# Patient Record
Sex: Female | Born: 1975 | Race: Black or African American | Hispanic: No | Marital: Single | Smoking: Never smoker
Health system: Southern US, Community
[De-identification: ages and names within clinical notes are randomized; demographics above are authoritative.]

## PROBLEM LIST (undated history)

## (undated) DIAGNOSIS — I10 Essential (primary) hypertension: Secondary | ICD-10-CM

## (undated) HISTORY — PX: ABDOMINAL HYSTERECTOMY: SHX81

## (undated) HISTORY — PX: KNEE SURGERY: SHX244

---

## 2008-10-12 ENCOUNTER — Emergency Department (HOSPITAL_BASED_OUTPATIENT_CLINIC_OR_DEPARTMENT_OTHER): Admission: EM | Admit: 2008-10-12 | Discharge: 2008-10-12 | Payer: Self-pay | Admitting: Emergency Medicine

## 2018-03-24 ENCOUNTER — Encounter (HOSPITAL_BASED_OUTPATIENT_CLINIC_OR_DEPARTMENT_OTHER): Payer: Self-pay | Admitting: *Deleted

## 2018-03-24 ENCOUNTER — Emergency Department (HOSPITAL_BASED_OUTPATIENT_CLINIC_OR_DEPARTMENT_OTHER): Payer: Self-pay

## 2018-03-24 ENCOUNTER — Other Ambulatory Visit: Payer: Self-pay

## 2018-03-24 DIAGNOSIS — M1712 Unilateral primary osteoarthritis, left knee: Secondary | ICD-10-CM | POA: Insufficient documentation

## 2018-03-24 DIAGNOSIS — I10 Essential (primary) hypertension: Secondary | ICD-10-CM | POA: Insufficient documentation

## 2018-03-24 NOTE — ED Triage Notes (Signed)
Pain in left knee since Sunday. Denies injury. States pain is worsening and now there is swelling. Hurts to walk

## 2018-03-25 ENCOUNTER — Emergency Department (HOSPITAL_BASED_OUTPATIENT_CLINIC_OR_DEPARTMENT_OTHER)
Admission: EM | Admit: 2018-03-25 | Discharge: 2018-03-25 | Disposition: A | Discharge status: Home / Self Care | Payer: Self-pay | Attending: Emergency Medicine | Admitting: Emergency Medicine

## 2018-03-25 DIAGNOSIS — M1712 Unilateral primary osteoarthritis, left knee: Secondary | ICD-10-CM

## 2018-03-25 HISTORY — DX: Essential (primary) hypertension: I10

## 2018-03-25 MED ORDER — MELOXICAM 7.5 MG PO TABS: 7.5000 mg | ORAL_TABLET | Freq: Every day | ORAL | 0 refills | Status: AC

## 2018-03-25 MED ORDER — MELOXICAM 7.5 MG PO TABS
7.5000 mg | ORAL_TABLET | Freq: Once | ORAL | Status: AC
  Administered 2018-03-25: 7.5 mg via ORAL
  Filled 2018-03-25: qty 1

## 2018-03-25 NOTE — ED Notes (Signed)
Pt given d/c instructions as per chart. Rx x 1. Verbalizes understanding. No questions. 

## 2018-03-25 NOTE — ED Provider Notes (Signed)
[facility] EMERGENCY DEPARTMENT Provider Note   CSN: [id number] Arrival date & time: 03/24/18  2304     History   Chief Complaint Chief Complaint  Patient presents with  . Knee Pain    HPI Michaela Jimenez is a 42 y.o. female.   Knee Pain   This is a new problem. The current episode started more than 2 days ago. The problem occurs daily. The problem has not changed since onset.The pain is present in the left knee. The quality of the pain is described as aching and sharp. The pain is mild. The symptoms are aggravated by standing and activity. She has tried nothing for the symptoms. There has been no history of extremity trauma. Family history is significant for no gout.    Past Medical History:  Diagnosis Date  . Hypertension     There are no active problems to display for this patient.   Past Surgical History:  Procedure Laterality Date  . ABDOMINAL HYSTERECTOMY    . CESAREAN SECTION    . KNEE SURGERY Bilateral      OB History   None      Home Medications    Prior to Admission medications   Medication Sig Start Date End Date Taking? Authorizing Provider  meloxicam (MOBIC) 7.5 MG tablet Take 1 tablet (7.5 mg total) by mouth daily. 03/25/18   Lyana Asbill, Barbara CowerJason, MD    Family History No family history on file.  Social History Social History   Tobacco Use  . Smoking status: Never Smoker  . Smokeless tobacco: Never Used  Substance Use Topics  . Alcohol use: Yes  . Drug use: Never     Allergies   Sulfa antibiotics   Review of Systems Review of Systems  Constitutional: Negative for fever.  Musculoskeletal: Positive for joint swelling (left knee, no redness or fever).  All other systems reviewed and are negative.    Physical Exam Updated Vital Signs BP (!) 134/97 (BP Location: Right Arm)   Pulse 80   Temp 98.1 F (36.7 C) (Oral)   Resp 20   Ht 5' (1.524 m)   Wt (!) 141.5 kg (312 lb)   SpO2 96%   BMI 60.93 kg/m   Physical Exam    Constitutional: She is oriented to person, place, and time. She appears well-developed and well-nourished.  HENT:  Head: Normocephalic and atraumatic.  Eyes: Conjunctivae and EOM are normal.  Neck: Normal range of motion.  Cardiovascular: Normal rate and regular rhythm.  Pulmonary/Chest: No stridor. No respiratory distress.  Abdominal: Soft. She exhibits no distension.  Musculoskeletal: Normal range of motion. She exhibits no edema, tenderness or deformity.  No obvious left knee edema, ttp, erythema, warmth or effusion  Neurological: She is alert and oriented to person, place, and time. No cranial nerve deficit. Coordination normal.  Skin: Skin is warm and dry.  Nursing note and vitals reviewed.    ED Treatments / Results  Labs (all labs ordered are listed, but only abnormal results are displayed) Labs Reviewed - No data to display  EKG None  Radiology Dg Knee Complete 4 Views Left  Result Date: 03/25/2018 CLINICAL DATA:  Acute onset left knee pain 1 week ago. No recent injury. EXAM: LEFT KNEE - COMPLETE 4+ VIEW COMPARISON:  None. FINDINGS: Tricompartmental osteoarthritis of the left knee without fracture or joint dislocation. No intra-articular loose bodies. Small suprapatellar joint effusion. No suspicious osseous lesions. Mild soft tissue induration along the lateral thigh. IMPRESSION: Soft tissue induration of  the lateral thigh. Tricompartmental osteoarthritis of the left knee without acute osseous abnormality. Small joint effusion Electronically Signed   By: Tollie Eth M.D.   On: 03/25/2018 00:46    Procedures Procedures (including critical care time)  Medications Ordered in ED Medications  meloxicam (MOBIC) tablet 7.5 mg (7.5 mg Oral Given 03/25/18 0214)     Initial Impression / Assessment and Plan / ED Course  I have reviewed the triage vital signs and the nursing notes.  Pertinent labs & imaging results that were available during my care of the patient were reviewed  by me and considered in my medical decision making (see chart for details).     Suspect weight as contributign factor along with arthritis. Will rx nsaids with pcp follow up. Ace wrap prn.   Final Clinical Impressions(s) / ED Diagnoses   Final diagnoses:  Osteoarthritis of left knee, unspecified osteoarthritis type    ED Discharge Orders        Ordered    meloxicam (MOBIC) 7.5 MG tablet  Daily     03/25/18 0221       Demetries Coia, Barbara Cower, MD 03/25/18 [phone]

## 2019-05-03 IMAGING — DX DG KNEE COMPLETE 4+V*L*
4 series · 4 of 4 positions shown · non-contrast
Comparison: None.

CLINICAL DATA: Acute onset left knee pain 1 week ago. No recent
injury.

EXAM:
LEFT KNEE - COMPLETE 4+ VIEW

[knee ap]
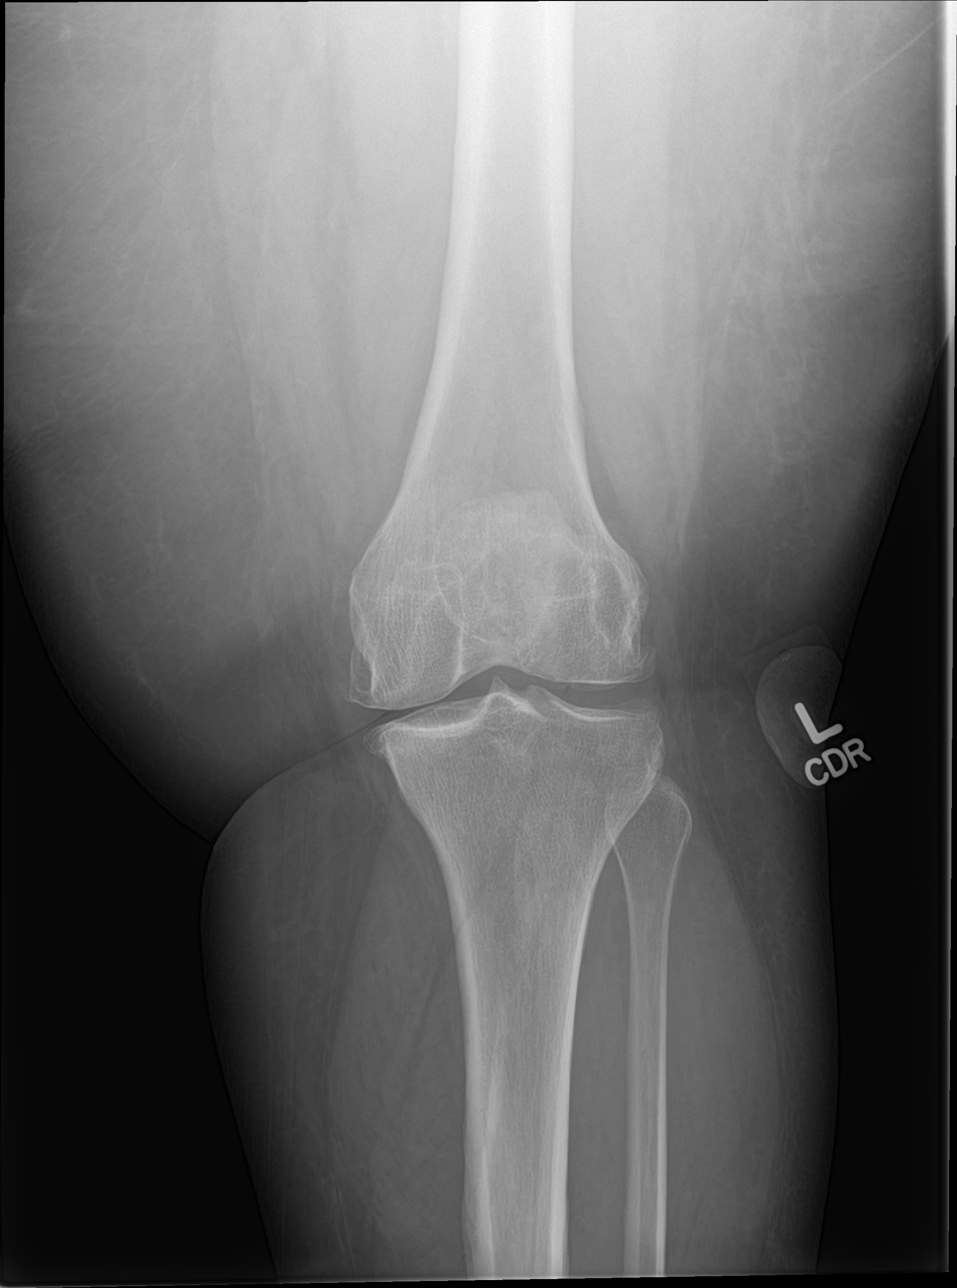

[knee obl (1 of 2)]
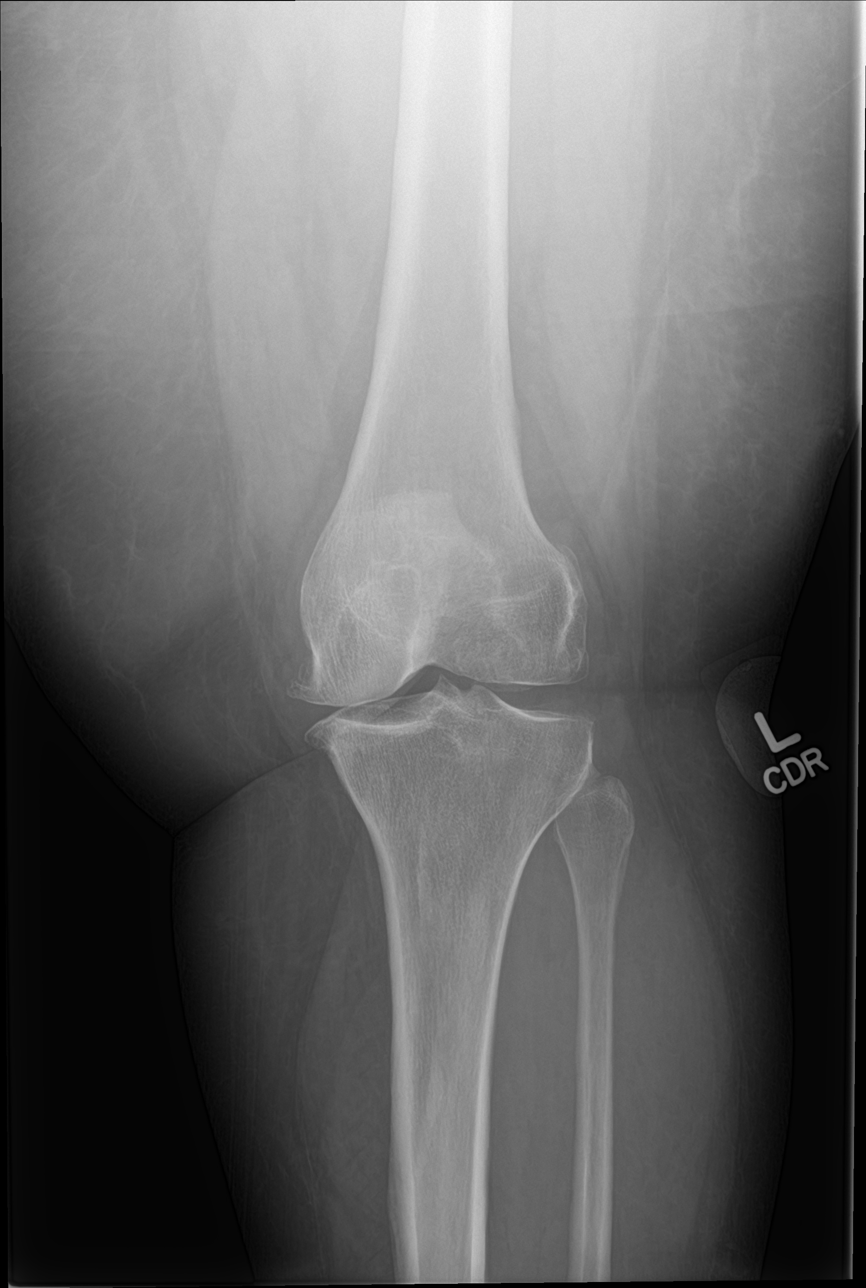

[knee obl (2 of 2)]
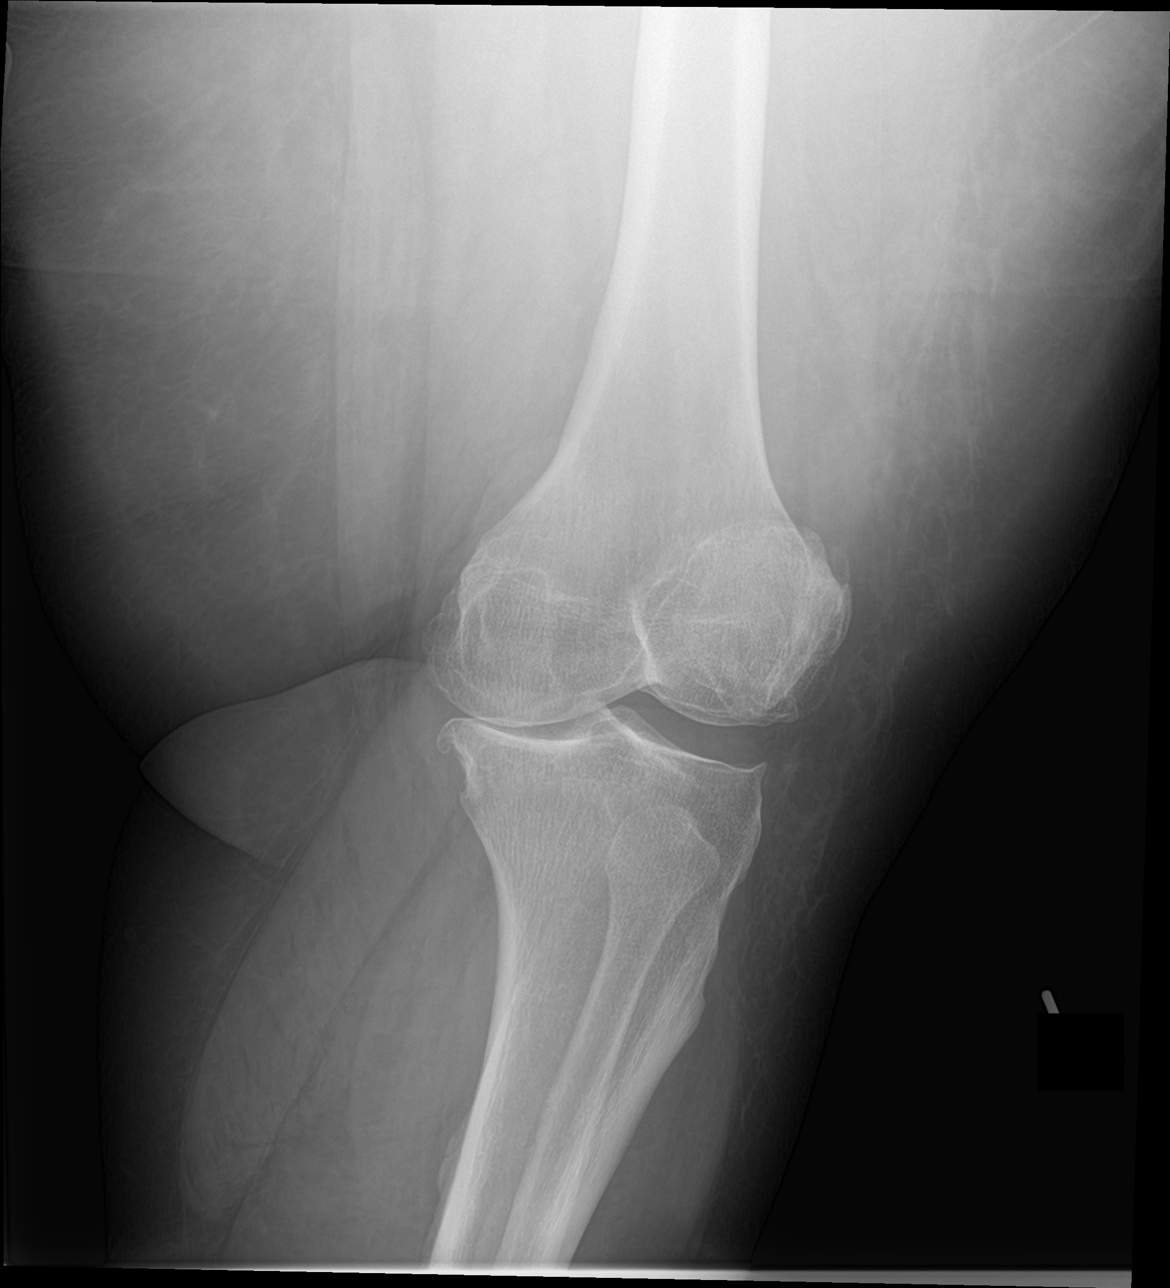

[knee lat]
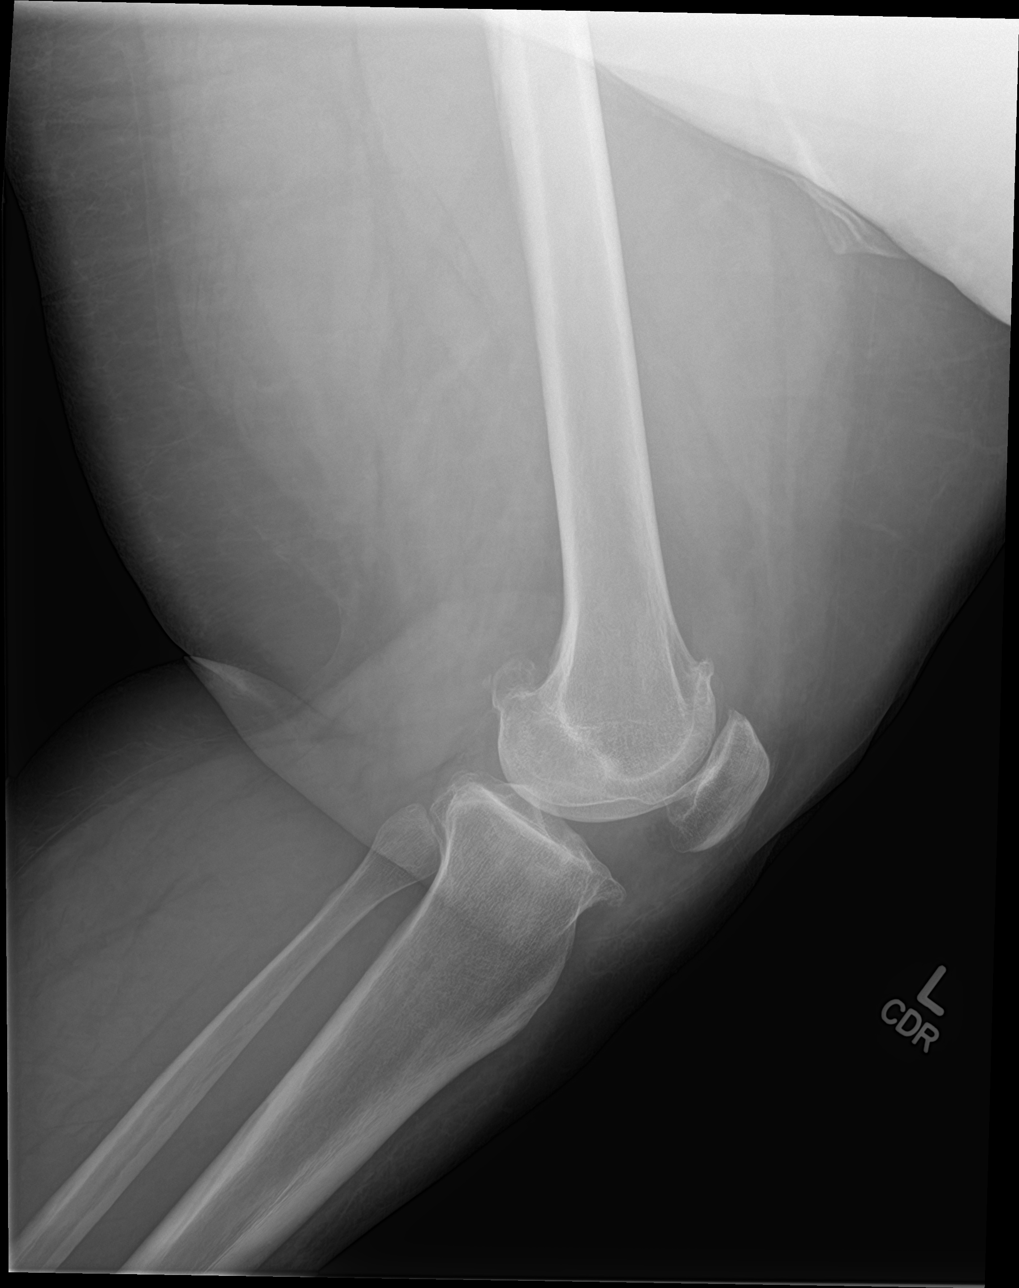

[4 of 4 positions shown; findings below may reference images not displayed]

FINDINGS: Tricompartmental osteoarthritis of the left knee without fracture or
joint dislocation. No intra-articular loose bodies. Small
suprapatellar joint effusion. No suspicious osseous lesions. Mild
soft tissue induration along the lateral thigh.
IMPRESSION: Soft tissue induration of the lateral thigh. Tricompartmental
osteoarthritis of the left knee without acute osseous abnormality.
Small joint effusion
